# Patient Record
Sex: Male | Born: 1985 | Race: White | Hispanic: No | Marital: Single
Health system: Southern US, Community
[De-identification: ages and names within clinical notes are randomized; demographics above are authoritative.]

---
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
## 2018-04-06 ENCOUNTER — Telehealth: Payer: Self-pay

## 2018-04-06 NOTE — Telephone Encounter (Signed)
error 

## 2019-01-23 ENCOUNTER — Other Ambulatory Visit: Payer: Self-pay | Admitting: Orthopedic Surgery

## 2019-01-23 DIAGNOSIS — M25562 Pain in left knee: Secondary | ICD-10-CM

## 2019-02-02 ENCOUNTER — Other Ambulatory Visit: Payer: Self-pay

## 2019-02-02 ENCOUNTER — Ambulatory Visit
Admission: RE | Admit: 2019-02-02 | Discharge: 2019-02-02 | Disposition: A | Discharge status: Home / Self Care | Payer: BLUE CROSS/BLUE SHIELD | Source: Ambulatory Visit | Attending: Orthopedic Surgery | Admitting: Orthopedic Surgery

## 2019-02-02 DIAGNOSIS — M25562 Pain in left knee: Secondary | ICD-10-CM

## 2020-08-20 IMAGING — MR MRI OF THE LEFT KNEE WITHOUT CONTRAST
5 of 9 series · 22 of 40 positions shown · non-contrast
Comparison: None.

CLINICAL DATA: Patient states that the left knee gave [DATE] weeks
ago resulting in a fall. Anterior and lateral knee pain.

EXAM:
MRI OF THE LEFT KNEE WITHOUT CONTRAST
TECHNIQUE: Multiplanar, multisequence MR imaging of the knee was performed. No
intravenous contrast was administered.

[Series 3: T2 fat-sat · axial · 4.0mm · 0.50mm/px · z∈[-69,+76]mm · 6 of 30 slices shown (1 of 3)]
[im 1/30]
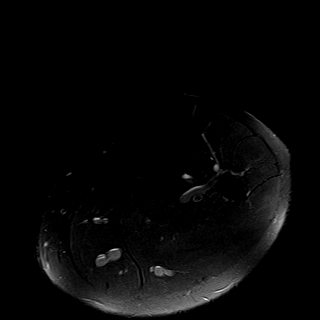
[im 6/30]
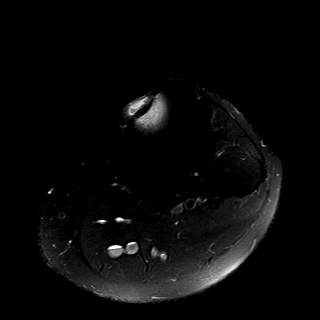
[im 12/30]
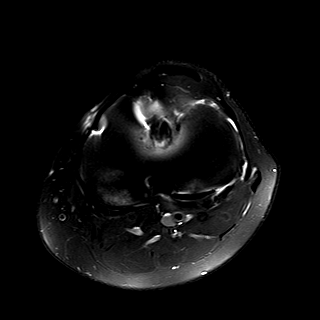
[im 18/30]
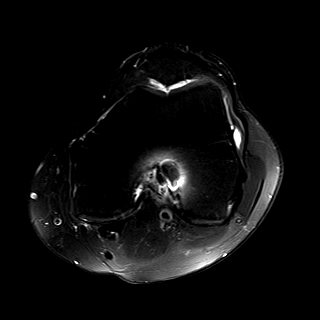
[im 24/30]
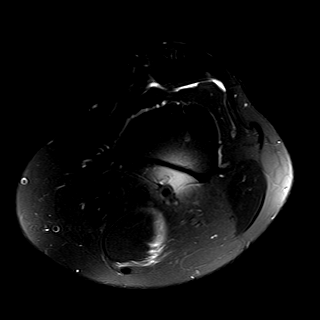
[im 30/30]
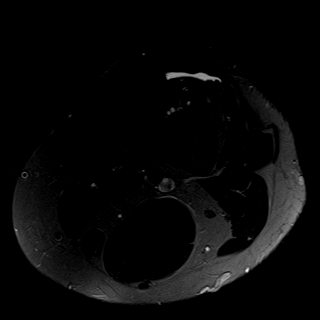

[Series 5: T2 fat-sat · coronal · 4.0mm · 0.31mm/px · 4 of 26 slices shown (2 of 3)]
[im 1/26]
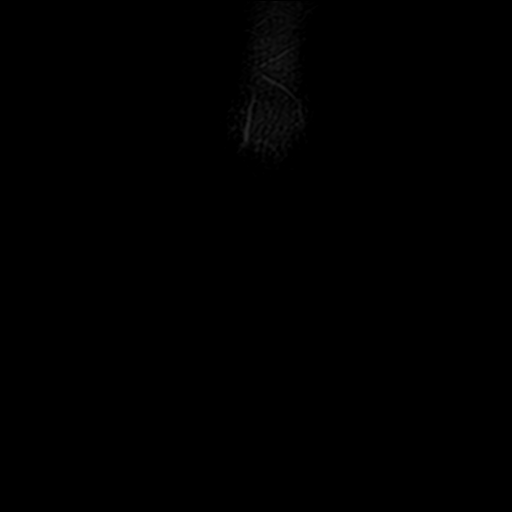
[im 9/26]
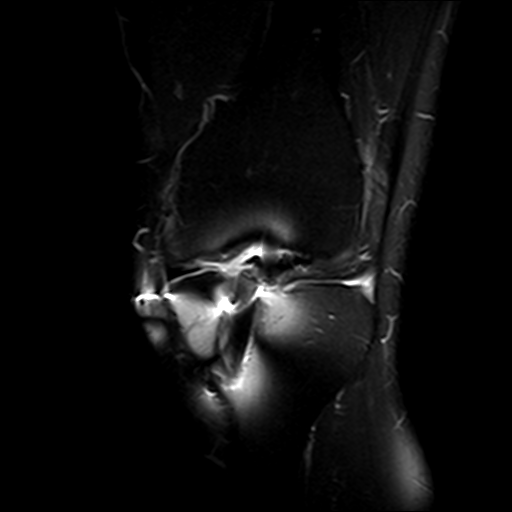
[im 17/26]
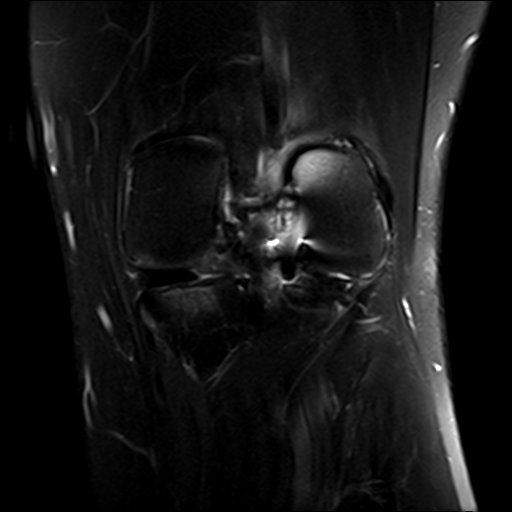
[im 26/26]
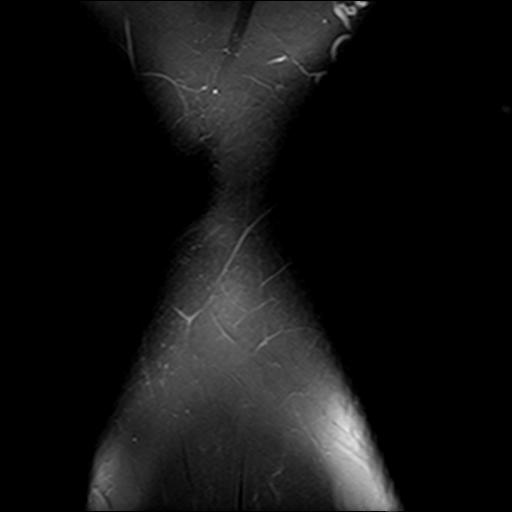

[Series 8: PD fat-sat · coronal · 3.0mm · 0.31mm/px · 5 of 32 slices shown (1 of 2)]
[im 1/32]
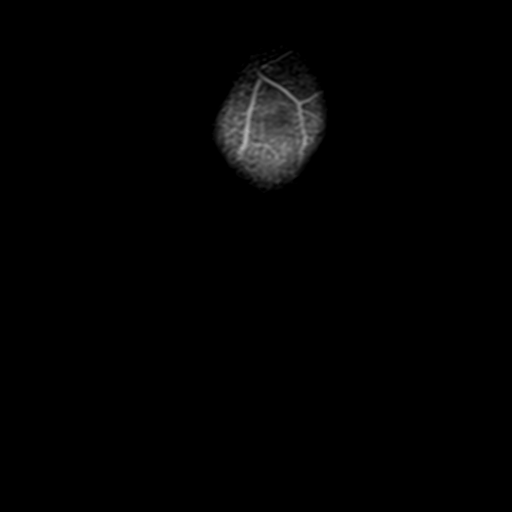
[im 8/32]
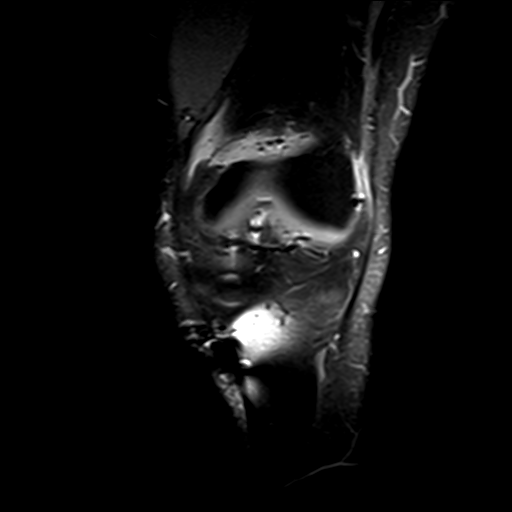
[im 16/32]
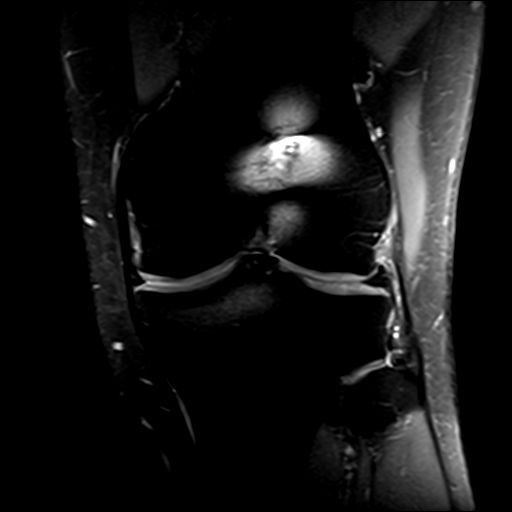
[im 24/32]
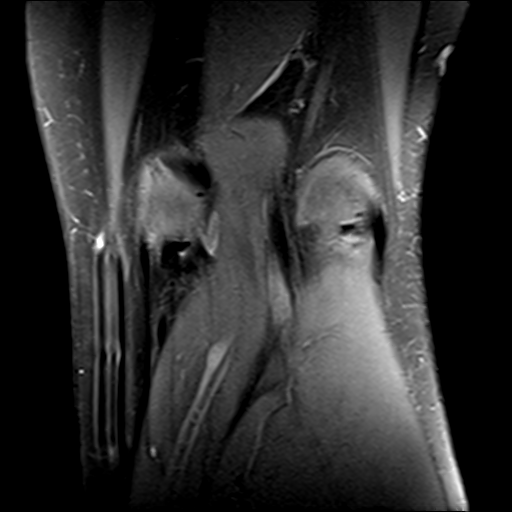
[im 32/32]
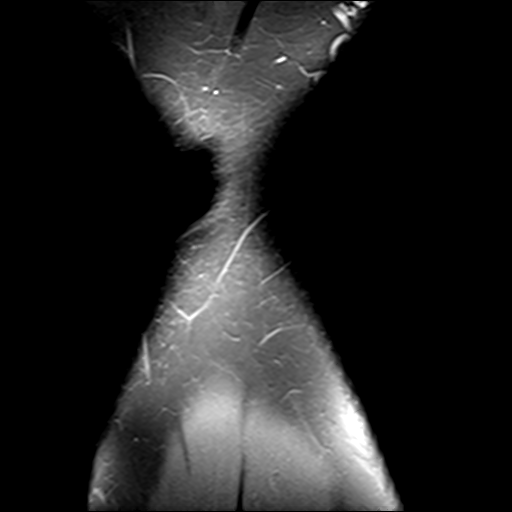

[Series 10: T2 fat-sat · sagittal · 3.0mm · 0.31mm/px · 3 of 28 slices shown (3 of 3)]
[im 1/28]
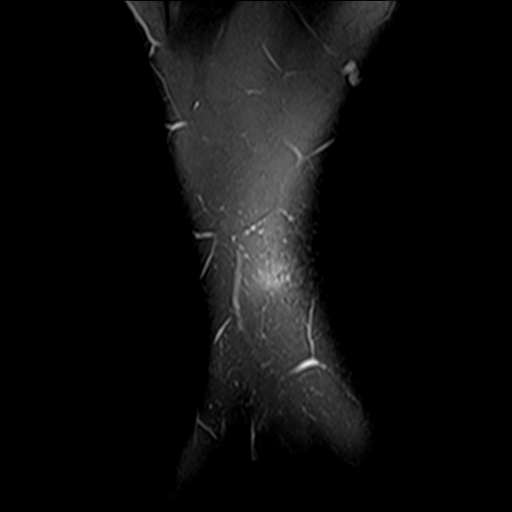
[im 10/28]
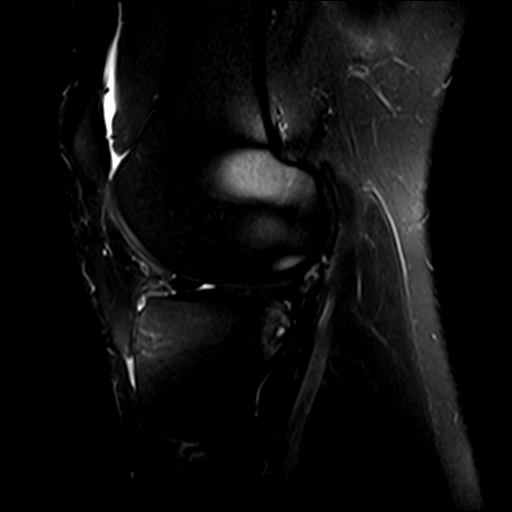
[im 19/28]
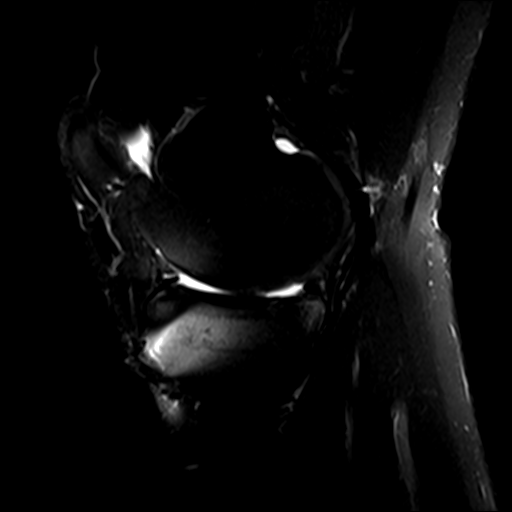

[Series 11: PD fat-sat · sagittal · 3.0mm · 0.31mm/px · 4 of 28 slices shown (2 of 2)]
[im 1/28]
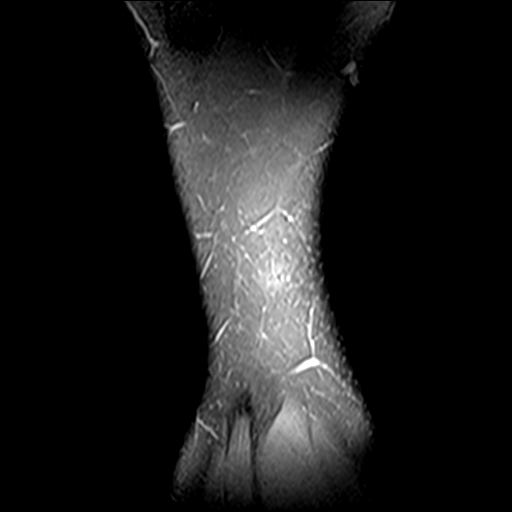
[im 10/28]
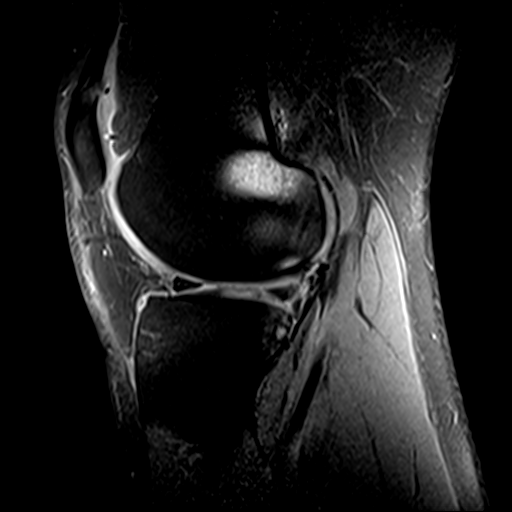
[im 19/28]
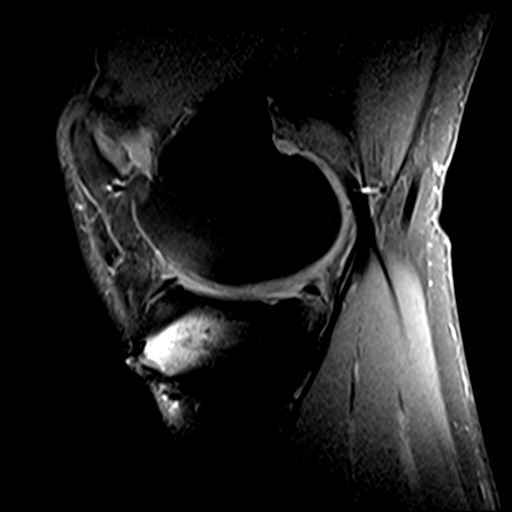
[im 28/28]
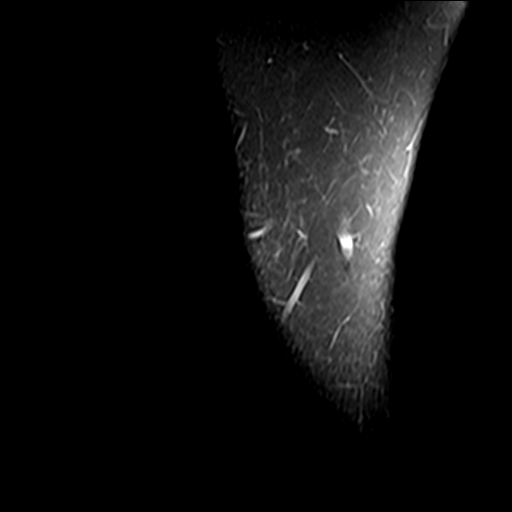

[22 of 40 positions shown; findings below may reference images not displayed]

FINDINGS: MENISCI

Medial meniscus: Prior meniscectomy of the posterior horn of medial
meniscus.

Lateral meniscus:  Intact.

LIGAMENTS

Cruciates: Prior ACL repair. Severe attenuation of the ACL graft
just proximal to its insertion concerning for a complete tear.
Intact PCL.

Collaterals: Medial collateral ligament is intact. Lateral
collateral ligament complex is intact.

CARTILAGE

Patellofemoral: Small focal area of partial-thickness cartilage loss
of the trochlear groove.

Medial: Partial-thickness cartilage loss of the medial femorotibial
compartment.

Lateral:  Cartilage irregularity of the lateral tibial plateau.

Joint: No significant joint effusion. Normal Hoffa's fat. No
aggressive osseous lesion. Small low signal foci along the lateral
trochlea, posterior nonweightbearing surface of the medial femoral
condyle and posterior not weight-bearing surface of the
posteromedial femoral condyle likely postsurgical.

Popliteal Fossa:  Tiny Baker's cyst.  Intact popliteus tendon.

Extensor Mechanism: Intact quadriceps tendon. Intact patellar
tendon. Intact medial patellar retinaculum. Intact lateral patellar
retinaculum. Intact MPFL.

Bones: Mild marrow edema in the posterolateral tibial plateau. Mild
marrow edema in the posteromedial tibial plateau. No other marrow
signal abnormality. No acute fracture or dislocation.

Other:
IMPRESSION: 1. Severe attenuation of the ACL graft just proximal to its
insertion concerning for a complete tear.
2. Prior meniscectomy of the posterior horn of medial meniscus.
3. Cartilage abnormalities of the left knee as described above.
